# Patient Record
Sex: Male | Born: 1965 | Race: Black or African American | Hispanic: No | Marital: Married | Smoking: Current some day smoker
Health system: Southern US, Community
[De-identification: ages and names within clinical notes are randomized; demographics above are authoritative.]

## PROBLEM LIST (undated history)

## (undated) DIAGNOSIS — T7840XA Allergy, unspecified, initial encounter: Secondary | ICD-10-CM

## (undated) HISTORY — DX: Allergy, unspecified, initial encounter: T78.40XA

---

## 2002-01-15 ENCOUNTER — Emergency Department (HOSPITAL_COMMUNITY): Admission: EM | Admit: 2002-01-15 | Discharge: 2002-01-15 | Payer: Self-pay

## 2002-12-24 ENCOUNTER — Encounter: Admission: RE | Admit: 2002-12-24 | Discharge: 2002-12-24 | Payer: Self-pay | Admitting: Chiropractic Medicine

## 2002-12-24 ENCOUNTER — Encounter: Payer: Self-pay | Admitting: Chiropractic Medicine

## 2013-04-30 ENCOUNTER — Ambulatory Visit: Payer: Self-pay | Admitting: Family Medicine

## 2013-04-30 VITALS — BP 122/70 | HR 77 | Temp 98.8°F | Resp 18 | Ht 69.0 in | Wt 166.0 lb

## 2013-04-30 DIAGNOSIS — Z0289 Encounter for other administrative examinations: Secondary | ICD-10-CM

## 2013-04-30 NOTE — Progress Notes (Signed)
Commercial Driver Medical Examination   Fernando Park is a 47 y.o. male who presents today for a commercial driver fitness determination physical exam. The patient reports no problems. The following portions of the patient's history were reviewed and updated as appropriate: allergies, current medications, past family history, past medical history, past social history, past surgical history and problem list. Review of Systems Pertinent items are noted in HPI.   Objective:    Vision:  Uncorrected Corrected Horizontal Field of Vision  Right Eye 20/40 na 85 degrees  Left Eye  20/25 na 85 degrees  Both Eyes  20/25 na    Applicant can recognize and distinguish among traffic control signals and devices showing standard red, green, and amber colors.     Monocular Vision?: No   Hearing:   500 Hz 1000 Hz 2000 Hz 4000 Hz  Right Ear  na na na na  Left Ear  na na na na      BP 122/70  Pulse 77  Temp(Src) 98.8 F (37.1 C) (Oral)  Resp 18  Ht 5\' 9"  (1.753 m)  Wt 166 lb (75.297 kg)  BMI 24.5 kg/m2  SpO2 99%  General Appearance:    Alert, cooperative, no distress, appears stated age  Head:    Normocephalic, without obvious abnormality, atraumatic  Eyes:    PERRL, conjunctiva/corneas clear, EOM's intact, fundi    benign, both eyes       Ears:    Normal TM's and external ear canals, both ears  Nose:   Nares normal, septum midline, mucosa normal, no drainage    or sinus tenderness  Throat:   Lips, mucosa, and tongue normal; teeth and gums normal  Neck:   Supple, symmetrical, trachea midline, no adenopathy;       thyroid:  No enlargement/tenderness/nodules; no carotid   bruit or JVD  Back:     Symmetric, no curvature, ROM normal, no CVA tenderness  Lungs:     Clear to auscultation bilaterally, respirations unlabored  Chest wall:    No tenderness or deformity  Heart:    Regular rate and rhythm, S1 and S2 normal, no murmur, rub   or gallop  Abdomen:     Soft, non-tender, bowel sounds  active all four quadrants,    no masses, no organomegaly  Genitalia:    Normal male without lesion, discharge or tenderness     Extremities:   Extremities normal, atraumatic, no cyanosis or edema  Pulses:   2+ and symmetric all extremities  Skin:   Skin color, texture, turgor normal, no rashes or lesions  Lymph nodes:   Cervical, supraclavicular, and axillary nodes normal  Neurologic:   CNII-XII intact. Normal strength, sensation and reflexes      throughout    Labs: No results found for this basename: SPECGRAV, PROTEINUR, BILIRUBINUR, GLUCOSEU  SG 1.020, trc prot, neg gluc, neg bld    Assessment:    Healthy male exam.  Meets standards in 63 CFR 391.41;  qualifies for 2 year certificate.    Plan:    Medical examiners certificate completed and printed. Return as needed.

## 2013-04-30 NOTE — Progress Notes (Signed)
ua-spgr-1.020,protein-trace,blood-neg,glucose-neg

## 2015-11-22 ENCOUNTER — Ambulatory Visit (INDEPENDENT_AMBULATORY_CARE_PROVIDER_SITE_OTHER): Payer: Self-pay | Admitting: Physician Assistant

## 2015-11-22 VITALS — BP 162/88 | HR 70 | Temp 98.4°F | Resp 18 | Ht 69.0 in | Wt 171.2 lb

## 2015-11-22 DIAGNOSIS — Z0289 Encounter for other administrative examinations: Secondary | ICD-10-CM

## 2015-11-22 DIAGNOSIS — Z021 Encounter for pre-employment examination: Secondary | ICD-10-CM

## 2015-11-22 NOTE — Progress Notes (Signed)
Commercial Driver Medical Examination   Fernando Park is a 50 y.o. male who presents today for a commercial driver fitness determination physical exam. The patient reports no problems. The following portions of the patient's history were reviewed and updated as appropriate: allergies, current medications, past family history, past medical history, past social history, past surgical history and problem list.  Pt denies hx HTN, DM, OSA. No tobacco use He does not have a PCP.  States he has never had problems with high blood pressure before. Only meds he takes are for allergies.   Review of Systems Pertinent items are noted in HPI.   Objective:    Vision/hearing:  Visual Acuity Screening   Right eye Left eye Both eyes  Without correction:     With correction:  Comments: Peripheral Vision: Right eye 85 degrees. Left eye 85 degrees.  The patient can distinguish the colors red, amber and green.  Hearing Screening Comments: The patient was able to hear a forced whisper from 10 feet.  Applicant can recognize and distinguish among traffic control signals and devices showing standard red, green, and amber colors.  Corrective lenses required: Yes  Monocular Vision?: No  Hearing aid requirement: No  Physical Exam  Constitutional: He is oriented to person, place, and time. He appears well-developed and well-nourished. No distress.  HENT:  Head: Normocephalic and atraumatic.  Right Ear: Hearing, tympanic membrane, external ear and ear canal normal.  Left Ear: Hearing, tympanic membrane, external ear and ear canal normal.  Nose: Nose normal.  Mouth/Throat: Uvula is midline, oropharynx is clear and moist and mucous membranes are normal.  Left TM obstructed by cerumen  Eyes: Conjunctivae, EOM and lids are normal. Pupils are equal, round, and reactive to light. Right eye exhibits no discharge. Left eye exhibits no discharge. No scleral icterus.  Neck: Trachea normal  and normal range of motion. Neck supple. Carotid bruit is not present. No thyromegaly present.  Cardiovascular: Normal rate, regular rhythm, normal heart sounds, intact distal pulses and normal pulses.   No murmur heard. Pulmonary/Chest: Effort normal and breath sounds normal. No respiratory distress. He has no wheezes. He has no rhonchi. He has no rales.  Abdominal: Soft. Normal appearance. There is no tenderness.  Musculoskeletal: Normal range of motion. He exhibits no edema or tenderness.  Lymphadenopathy:       Head (right side): No submental, no submandibular, no tonsillar, no preauricular and no posterior auricular adenopathy present.       Head (left side): No submental, no submandibular, no tonsillar, no preauricular and no posterior auricular adenopathy present.    He has no cervical adenopathy.  Neurological: He is alert and oriented to person, place, and time. He has normal strength and normal reflexes. No cranial nerve deficit. Coordination and gait normal.  Skin: Skin is warm, dry and intact. No lesion and no rash noted.  Psychiatric: He has a normal mood and affect. His speech is normal and behavior is normal. Judgment and thought content normal.   BP 146/62 mmHg  Pulse 70  Temp(Src) 98.4 F (36.9 C) (Oral)  Resp 18  Ht  (1.753 m)  Wt 171 lb 3.2 oz (77.656 kg)  BMI 25.27 kg/m2  SpO2 100%  Labs:  SP GR 1.010, PRO neg BLOOD tr  GLU neg  Assessment:    Healthy male exam.  Meets standards, but periodic monitoring required due to HTN.  Driver qualified only for 3 months.    Plan:  Medical examiners certificate completed and printed. Need follow-up in 3 months for recheck of HTN. -- lowest BP was 146/62, other BPs 160/80 and 148/86 Pt has been advised to establish care with PCP for treatment of BP.

## 2015-11-22 NOTE — Patient Instructions (Signed)
     IF you received an x-ray today, you will receive an invoice from [facility]. Please contact [facility] at [phone] with questions or concerns regarding your invoice.   IF you received labwork today, you will receive an invoice from [facility]. Please contact [facility] at [phone] with questions or concerns regarding your invoice.   Our billing staff will not be able to assist you with questions regarding bills from these companies.  You will be contacted with the lab results as soon as they are available. The fastest way to get your results is to activate your My Chart account. Instructions are located on the last page of this paperwork. If you have not heard from us regarding the results in 2 weeks, please contact this office.      

## 2016-03-24 ENCOUNTER — Ambulatory Visit: Payer: Self-pay

## 2016-04-19 ENCOUNTER — Ambulatory Visit (INDEPENDENT_AMBULATORY_CARE_PROVIDER_SITE_OTHER): Payer: Self-pay | Admitting: Family Medicine

## 2016-04-19 VITALS — BP 120/70 | HR 68 | Temp 97.7°F | Resp 16 | Ht 69.0 in | Wt 167.4 lb

## 2016-04-19 DIAGNOSIS — Z024 Encounter for examination for driving license: Secondary | ICD-10-CM

## 2016-04-19 NOTE — Patient Instructions (Signed)
     IF you received an x-ray today, you will receive an invoice from [facility]. Please contact [facility] at [phone] with questions or concerns regarding your invoice.   IF you received labwork today, you will receive an invoice from [facility]. Please contact [facility] at [phone] with questions or concerns regarding your invoice.   Our billing staff will not be able to assist you with questions regarding bills from these companies.  You will be contacted with the lab results as soon as they are available. The fastest way to get your results is to activate your My Chart account. Instructions are located on the last page of this paperwork. If you have not heard from us regarding the results in 2 weeks, please contact this office.      

## 2016-04-19 NOTE — Progress Notes (Signed)
Commercial Driver Medical Examination   Fernando SaxonRobert B Park is a 50 y.o. male who presents today for a commercial driver fitness determination physical exam. The patient reports no problems.  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 No prior h/o HTN.  He was given a temporary card and recommended to follow-up with his PCP for further eval and treatment. Since that time pt has increased exercise, changed diet, and started asa 81mg . He has checked his BP outside of the office several times and BP has been in 120s/80s.  The following portions of the patient's history were reviewed and updated as appropriate: allergies, current medications, past family history, past medical history, past social history, past surgical history and problem list.  Review of Systems Pertinent items are noted in HPI.   Objective:    Vision:   Visual Acuity Screening   Right eye Left eye Both eyes  Without correction:     With correction: 20/30 20/30 20/20   Comments: Color: 6/6 Normal, Peripheral 85 degrees both eyes. Normal    Applicant can recognize and distinguish among traffic control signals and devices showing standard red, green, and amber colors.  Applicant meets visual acuity requirement only when wearing corrective lenses.  Monocular Vision?: No   Hearing: Hearing Screening Comments: Whisper test at 10 ft. Normal.    BP 120/70   Pulse 68   Temp 97.7 F (36.5 C) (Oral)   Resp 16   Ht 5\' 9"  (1.753 m)   Wt 167 lb 6.4 oz (75.9 kg)   SpO2 99%   BMI 24.72 kg/m   General Appearance:    Alert, cooperative, no distress, appears stated age  Head:    Normocephalic, without obvious abnormality, atraumatic  Eyes:    PERRL, conjunctiva/corneas clear, EOM's intact, fundi    benign, both eyes       Ears:    Normal TM's and external ear canals, both ears  Nose:   Nares normal, septum midline, mucosa normal, no drainage    or sinus tenderness  Throat:   Lips, mucosa, and tongue  normal; teeth and gums normal  Neck:   Supple, symmetrical, trachea midline, no adenopathy;       thyroid:  No enlargement/tenderness/nodules; no carotid   bruit or JVD  Back:     Symmetric, no curvature, ROM normal, no CVA tenderness  Lungs:     Clear to auscultation bilaterally, respirations unlabored  Chest wall:    No tenderness or deformity  Heart:    Regular rate and rhythm, S1 and S2 normal, no murmur, rub   or gallop  Abdomen:     Soft, non-tender, bowel sounds active all four quadrants,    no masses, no organomegaly  Genitalia:  No inguinal hernias     Extremities:   Extremities normal, atraumatic, no cyanosis or edema  Pulses:   2+ and symmetric all extremities  Skin:   Skin color, texture, turgor normal, no rashes or lesions  Lymph nodes:   Cervical, supraclavicular, and axillary nodes normal  Neurologic:   CNII-XII intact. Normal strength, sensation and reflexes      throughout     Labs:  SG 1.020, blood small, prot neg, gluc neg, leuks small  Assessment:    Healthy male exam.  Meets standards in 5449 CFR 391.41;  qualifies for 2 year certificate.   with corrective lenses Plan:    Medical examiners certificate completed and printed. Return as needed.

## 2016-08-26 ENCOUNTER — Ambulatory Visit (INDEPENDENT_AMBULATORY_CARE_PROVIDER_SITE_OTHER): Payer: Managed Care, Other (non HMO) | Admitting: Family Medicine

## 2016-08-26 VITALS — BP 120/60 | HR 70 | Temp 98.2°F | Resp 16 | Ht 70.0 in | Wt 167.0 lb

## 2016-08-26 DIAGNOSIS — Z131 Encounter for screening for diabetes mellitus: Secondary | ICD-10-CM

## 2016-08-26 DIAGNOSIS — Z23 Encounter for immunization: Secondary | ICD-10-CM

## 2016-08-26 DIAGNOSIS — Z1329 Encounter for screening for other suspected endocrine disorder: Secondary | ICD-10-CM | POA: Diagnosis not present

## 2016-08-26 DIAGNOSIS — Z0000 Encounter for general adult medical examination without abnormal findings: Secondary | ICD-10-CM

## 2016-08-26 DIAGNOSIS — Z130 Encounter for screening for diseases of the blood and blood-forming organs and certain disorders involving the immune mechanism: Secondary | ICD-10-CM | POA: Diagnosis not present

## 2016-08-26 DIAGNOSIS — Z13220 Encounter for screening for lipoid disorders: Secondary | ICD-10-CM | POA: Diagnosis not present

## 2016-08-26 LAB — POCT URINALYSIS DIP (MANUAL ENTRY)
BILIRUBIN UA: NEGATIVE
Bilirubin, UA: NEGATIVE
GLUCOSE UA: NEGATIVE
LEUKOCYTES UA: NEGATIVE
Nitrite, UA: NEGATIVE
PROTEIN UA: NEGATIVE
SPEC GRAV UA: 1.02
Urobilinogen, UA: 0.2
pH, UA: 5.5

## 2016-08-26 NOTE — Patient Instructions (Addendum)
You will notified once your labs have resulted.  I will complete and fax your form. The original will remain here at the office for you to pick up with lab results listed.  As we discussed, your colonoscopy will be due after your 51th birthday. I am including information about Cologuard testing if you would like this option oppose to traditional colonoscopy screening for colon cancer.    IF you received an x-ray today, you will receive an invoice from [facility]. Please contact [facility] at [phone] with questions or concerns regarding your invoice.   IF you received labwork today, you will receive an invoice from [facility]. Please contact [facility] at [phone] with questions or concerns regarding your invoice.   Our billing staff will not be able to assist you with questions regarding bills from these companies.  You will be contacted with the lab results as soon as they are available. The fastest way to get your results is to activate your My Chart account. Instructions are located on the last page of this paperwork. If you have not heard from [place] regarding the results in 2 weeks, please contact this office.     Exercising to Stay Healthy Introduction Exercising regularly is important. It has many health benefits, such as:  Improving your overall fitness, flexibility, and endurance.  Increasing your bone density.  Helping with weight control.  Decreasing your body fat.  Increasing your muscle strength.  Reducing stress and tension.  Improving your overall health. In order to become healthy and stay healthy, it is recommended that you do moderate-intensity and vigorous-intensity exercise. You can tell that you are exercising at a moderate intensity if you have a higher heart rate and faster breathing, but you are still able to hold a conversation. You can tell that you are exercising at a vigorous intensity if you are breathing much harder and faster and  cannot hold a conversation while exercising. How often should I exercise? Choose an activity that you enjoy and set realistic goals. Your health care provider can help you to make an activity plan that works for you. Exercise regularly as directed by your health care provider. This may include:  Doing resistance training twice each week, such as:  Push-ups.  Sit-ups.  Lifting weights.  Using resistance bands.  Doing a given intensity of exercise for a given amount of time. Choose from these options:  150 minutes of moderate-intensity exercise every week.  75 minutes of vigorous-intensity exercise every week.  A mix of moderate-intensity and vigorous-intensity exercise every week. Children, pregnant women, people who are out of shape, people who are overweight, and older adults may need to consult a health care provider for individual recommendations. If you have any sort of medical condition, be sure to consult your health care provider before starting a new exercise program. What are some exercise ideas? Some moderate-intensity exercise ideas include:  Walking at a rate of 1 mile in 15 minutes.  Biking.  Hiking.  Golfing.  Dancing. Some vigorous-intensity exercise ideas include:  Walking at a rate of at least 4.5 miles per hour.  Jogging or running at a rate of 5 miles per hour.  Biking at a rate of at least 10 miles per hour.  Lap swimming.  Roller-skating or in-line skating.  Cross-country skiing.  Vigorous competitive sports, such as football, basketball, and soccer.  Jumping rope.  Aerobic dancing. What are some everyday activities that can help me to get exercise?  Yard work, such as:  Pushing a Surveyor, mininglawn mower.  Raking and bagging leaves.  Washing and waxing your car.  Pushing a stroller.  Shoveling snow.  Gardening.  Washing windows or floors. How can I be more active in my day-to-day activities?  Use the stairs instead of the  elevator.  Take a walk during your lunch break.  If you drive, park your car farther away from work or school.  If you take public transportation, get off one stop early and walk the rest of the way.  Make all of your phone calls while standing up and walking around.  Get up, stretch, and walk around every 30 minutes throughout the day. What guidelines should I follow while exercising?  Do not exercise so much that you hurt yourself, feel dizzy, or get very short of breath.  Consult your health care provider before starting a new exercise program.  Wear comfortable clothes and shoes with good support.  Drink plenty of water while you exercise to prevent dehydration or heat stroke. Body water is lost during exercise and must be replaced.  Work out until you breathe faster and your heart beats faster. This information is not intended to replace advice given to you by your health care provider. Make sure you discuss any questions you have with your health care provider. Document Released: 07/29/2010 Document Revised: 12/02/2015 Document Reviewed: 11/27/2013  2017 Elsevier

## 2016-08-26 NOTE — Progress Notes (Signed)
Fernando SaxonRobert B Mccreery  MRN: [id number] DOB: 05/31/1966  Subjective:   Fernando MaduroRobert is a 51 y.o. male who presents for annual physical exam and requests to have employee health forms completed. Pt is currently working as an Engineer, siteHVAC technician. Last CPE was two years ago, although he recently completed a DOT. physical within the last 6 months. He denies any acute concerns or problems today. Last dental exam: appointment scheduled for March 2018 Last vision exam: 2017 vision exam and received a glaucoma screening which was normal.  Outstanding Health Maintenance items include: HIV Testing and TDAP vaccination   Current everyday smoker of cigars for greather than 20 years.   Current Outpatient Prescriptions on File Prior to Visit  Medication Sig Dispense Refill  . triamcinolone (NASACORT ALLERGY 24HR) 55 MCG/ACT AERO nasal inhaler Place 2 sprays into the nose daily.     No current facility-administered medications on file prior to visit.     No Known Allergies  Social History   Social History  . Marital status: Married    Spouse name: N/A  . Number of children: N/A  . Years of education: N/A   Social History Main Topics  . Smoking status: Current Some Day Smoker    Types: Cigars  . Smokeless tobacco: Never Used  . Alcohol use Yes     Comment: occas  . Drug use: No  . Sexual activity: Not Asked   Other Topics Concern  . None   Social History Narrative  . None    No past surgical history on file.  Family History  Problem Relation Age of Onset  . Hypertension Mother   . Arthritis Mother     Review of Systems  HENT: Negative.   Respiratory: Negative.   Cardiovascular: Negative.   Genitourinary: Negative.   Musculoskeletal: Positive for back pain.       Chronic pain that he self manages with repositioning and strength training exercises   Neurological: Negative.   Psychiatric/Behavioral: The patient is nervous/anxious.     Objective:  BP 120/60   Pulse 70   Temp 98.2  F (36.8 C) (Oral)   Resp 16   Ht 5\' 10"  (1.778 m)   Wt 167 lb (75.8 kg)   SpO2 98%   BMI 23.96 kg/m   Physical Exam  Constitutional: He is oriented to person, place, and time and well-developed, well-nourished, and in no distress.  HENT:  Head: Normocephalic and atraumatic.  Right Ear: External ear normal.  Left Ear: External ear normal.  Nose: Nose normal.  Mouth/Throat: Oropharynx is clear and moist.  Eyes: Conjunctivae and EOM are normal. Pupils are equal, round, and reactive to light.  Neck: Normal range of motion. Neck supple.  Cardiovascular: Normal rate, normal heart sounds and intact distal pulses.   Pulmonary/Chest: Effort normal and breath sounds normal.  Musculoskeletal: Normal range of motion.  Neurological: He is alert and oriented to person, place, and time. Gait normal. GCS score is 15.  Skin: Skin is warm and dry.  Psychiatric: Mood, memory and judgment normal. He has a flat affect.    Visual Acuity Screening   Right eye Left eye Both eyes  Without correction: 20/50 -1 20/25 -1 20/25  With correction:       Assessment and Plan :  Discussed healthy lifestyle, diet, exercise, preventative care, vaccinations, and addressed patient's concerns. Plan for follow up in 12 months for annual exam if all labs are within normal range. Otherwise, plan for specific conditions below.  1. Annual physical exam Age-appropriate anticipatory guidance provided.  2. Screening cholesterol level   3. Screening for deficiency anemia  4. Screening for thyroid disorder  5. Screening for diabetes mellitus   I will complete your employee health form upon receipt of your lab results.  Godfrey Pick. Tiburcio Pea, MSN, FNP-C Primary Care at [facility] [phone]

## 2016-08-27 LAB — CMP14+EGFR
A/G RATIO: 1.7 (ref 1.2–2.2)
ALBUMIN: 4.7 g/dL (ref 3.5–5.5)
ALT: 22 IU/L (ref 0–44)
AST: 24 IU/L (ref 0–40)
Alkaline Phosphatase: 65 IU/L (ref 39–117)
BUN / CREAT RATIO: 9 (ref 9–20)
BUN: 10 mg/dL (ref 6–24)
Bilirubin Total: 0.5 mg/dL (ref 0.0–1.2)
CALCIUM: 10 mg/dL (ref 8.7–10.2)
CO2: 26 mmol/L (ref 18–29)
CREATININE: 1.13 mg/dL (ref 0.76–1.27)
Chloride: 99 mmol/L (ref 96–106)
GFR, EST AFRICAN AMERICAN: 88 mL/min/{1.73_m2} (ref >59)
GFR, EST NON AFRICAN AMERICAN: 76 mL/min/{1.73_m2} (ref >59)
GLOBULIN, TOTAL: 2.7 g/dL (ref 1.5–4.5)
Glucose: 102 mg/dL — ABNORMAL HIGH (ref 65–99)
POTASSIUM: 4.4 mmol/L (ref 3.5–5.2)
SODIUM: 141 mmol/L (ref 134–144)
Total Protein: 7.4 g/dL (ref 6.0–8.5)

## 2016-08-27 LAB — CBC WITH DIFFERENTIAL/PLATELET
BASOS: 0 %
Basophils Absolute: 0 10*3/uL (ref 0.0–0.2)
EOS (ABSOLUTE): 0.1 10*3/uL (ref 0.0–0.4)
EOS: 1 %
HEMATOCRIT: 42.6 % (ref 37.5–51.0)
Hemoglobin: 14.9 g/dL (ref 13.0–17.7)
IMMATURE GRANULOCYTES: 0 %
Immature Grans (Abs): 0 10*3/uL (ref 0.0–0.1)
LYMPHS ABS: 3 10*3/uL (ref 0.7–3.1)
Lymphs: 32 %
MCH: 30 pg (ref 26.6–33.0)
MCHC: 35 g/dL (ref 31.5–35.7)
MCV: 86 fL (ref 79–97)
MONOS ABS: 0.7 10*3/uL (ref 0.1–0.9)
Monocytes: 8 %
NEUTROS ABS: 5.6 10*3/uL (ref 1.4–7.0)
Neutrophils: 59 %
Platelets: 342 10*3/uL (ref 150–379)
RBC: 4.96 x10E6/uL (ref 4.14–5.80)
RDW: 13.7 % (ref 12.3–15.4)
WBC: 9.5 10*3/uL (ref 3.4–10.8)

## 2016-08-27 LAB — LIPID PANEL
CHOL/HDL RATIO: 3.3 ratio (ref 0.0–5.0)
Cholesterol, Total: 169 mg/dL (ref 100–199)
HDL: 51 mg/dL (ref >39)
LDL Calculated: 101 mg/dL — ABNORMAL HIGH (ref 0–99)
Triglycerides: 85 mg/dL (ref 0–149)
VLDL Cholesterol Cal: 17 mg/dL (ref 5–40)

## 2016-08-27 LAB — TSH: TSH: 0.726 u[IU]/mL (ref 0.450–4.500)

## 2016-08-29 ENCOUNTER — Telehealth: Payer: Self-pay | Admitting: Family Medicine

## 2016-08-29 NOTE — Telephone Encounter (Signed)
Call patient to return to office to sign employment physical form and we need to obtain his waist circumference as this was not completed during his original visit.  Once completed, scan a copy to patient EMR and fax per document.   Godfrey PickKimberly S. Tiburcio PeaHarris, MSN, FNP-C Primary Care at [facility] [phone]

## 2016-08-29 NOTE — Telephone Encounter (Signed)
Returned, faxed and sent to scan . Pt got copy

## 2017-10-20 ENCOUNTER — Encounter: Payer: Self-pay | Admitting: Physician Assistant

## 2017-10-20 ENCOUNTER — Ambulatory Visit (INDEPENDENT_AMBULATORY_CARE_PROVIDER_SITE_OTHER): Payer: 59 | Admitting: Physician Assistant

## 2017-10-20 ENCOUNTER — Other Ambulatory Visit: Payer: Self-pay

## 2017-10-20 VITALS — BP 138/76 | HR 89 | Temp 98.2°F | Resp 16 | Ht 69.5 in | Wt 162.6 lb

## 2017-10-20 DIAGNOSIS — F4310 Post-traumatic stress disorder, unspecified: Secondary | ICD-10-CM | POA: Diagnosis not present

## 2017-10-20 MED ORDER — FLUOXETINE HCL 10 MG PO TABS: 10.0000 mg | ORAL_TABLET | Freq: Every day | ORAL | 1 refills | Status: DC

## 2017-10-20 NOTE — Progress Notes (Signed)
   Valentino SaxonRobert B Collister  MRN: [id number] DOB: 03/22/1966  PCP: Patient, No Pcp Per  Subjective:  Pt is a 52 year old male who presents to clinic for mood disorder.   He was dx with PTSD and dysthymia in 2002. Was prescribed Zoloft 50mg  at that time, but after his former clinic closed, "I have been getting Zoloft off of the street and I don't want to do it like this anymore."  When not taking Zoloft he is easily agitated especially with his 51 year old son.  He is requesting Zoloft 50mg  today. C/o profuse night sweats while taking this medication. He does not experience night sweats while not taking this. He does not consistently have this medication as he orders it from [facility] or buys it from dealers from the street. He now has insurance so "why not" get it from clinic.  He lives with his wife and 2 children.  Denies current SI or HI. He has had suicidal thoughts in the past. Has never acted on them.   Review of Systems  Constitutional: Positive for diaphoresis. Negative for fatigue.  Gastrointestinal: Negative for abdominal pain, nausea and vomiting.  Psychiatric/Behavioral: Positive for dysphoric mood. Negative for self-injury and suicidal ideas. The patient is nervous/anxious.     There are no active problems to display for this patient.   Current Outpatient Medications on File Prior to Visit  Medication Sig Dispense Refill  . triamcinolone (NASACORT ALLERGY 24HR) 55 MCG/ACT AERO nasal inhaler Place 2 sprays into the nose daily.     No current facility-administered medications on file prior to visit.     No Known Allergies   Objective:  BP 138/76   Pulse 89   Temp 98.2 F (36.8 C) (Oral)   Resp 16   Ht 5' 9.5" (1.765 m)   Wt 162 lb 9.6 oz (73.8 kg)   SpO2 99%   BMI 23.67 kg/m   Physical Exam  Constitutional: He is oriented to person, place, and time. He appears well-developed and well-nourished.  Cardiovascular: Normal rate and regular rhythm.  Pulmonary/Chest: Effort  normal. No respiratory distress.  Neurological: He is alert and oriented to person, place, and time.  Skin: Skin is warm and dry.  Psychiatric: He has a normal mood and affect. His behavior is normal. Judgment and thought content normal.  Vitals reviewed.   Assessment and Plan :  1. PTSD (post-traumatic stress disorder) - FLUoxetine (PROZAC) 10 MG tablet; Take 1 tablet (10 mg total) by mouth daily. After 3 weeks of therapy you may increase dose by 10mg  increments in weekly intervals  Dispense: 90 tablet; Refill: 1 - Pt presents for medication rx of Zoloft, which he has been getting off the street for the past several years. Denies SI or HI. C/o SE of night sweats. Plan to try prozac with instruction to titrate up. RTC in 6 weeks for annual exam. Plan to recheck PTSD and medication dose at that visit. He understands and agrees with plan.   Marco CollieWhitney Landree Fernholz, PA-C  Primary Care at [facility] 10/20/2017 12:10 PM

## 2017-10-20 NOTE — Patient Instructions (Addendum)
Start taking Prozac 38m. After 3 weeks, you can start increasing the dose in 174mincrements x 1 week (see below) Starting dose: 10 mg once daily for 3 weeks.  If needed you can increase the dose to 2017may. Take this for at least 1-2 weeks. (I suspect you will feel good on 58m43mf needed you can increase to 30mg44m. Take this for at least 1-2 weeks.   (The max dose is 80 mg/day. The usual dosage range is 20 - 60 mg/day)    Please come back and see me in 6-8 weeks for recheck.   Thank you for coming in today. I hope you feel we met your needs.  Feel free to call PCP if you have any questions or further requests.  Please consider signing up for MyChart if you do not already have it, as this is a great way to communicate with me.  Best,  Whitney McVey, PA-C  Fluoxetine capsules or tablets (Depression/Mood Disorders) What is this medicine? FLUOXETINE (floo OX e teen) belongs to a class of drugs known as selective serotonin reuptake inhibitors (SSRIs). It helps to treat mood problems such as depression, obsessive compulsive disorder, and panic attacks. It can also treat certain eating disorders. This medicine may be used for other purposes; ask your health care provider or pharmacist if you have questions. COMMON BRAND NAME(S): Prozac What should I tell my health care provider before I take this medicine? They need to know if you have any of these conditions: -bipolar disorder or a family history of bipolar disorder -bleeding disorders -glaucoma -heart disease -liver disease -low levels of sodium in the blood -seizures -suicidal thoughts, plans, or attempt; a previous suicide attempt by you or a family member -take MAOIs like Carbex, Eldepryl, Marplan, Nardil, and Parnate -take medicines that treat or prevent blood clots -thyroid disease -an unusual or allergic reaction to fluoxetine, other medicines, foods, dyes, or preservatives -pregnant or trying to get  pregnant -breast-feeding How should I use this medicine? Take this medicine by mouth with a glass of water. Follow the directions on the prescription label. You can take this medicine with or without food. Take your medicine at regular intervals. Do not take it more often than directed. Do not stop taking this medicine suddenly except upon the advice of your doctor. Stopping this medicine too quickly may cause serious side effects or your condition may worsen. A special MedGuide will be given to you by the pharmacist with each prescription and refill. Be sure to read this information carefully each time. Talk to your pediatrician regarding the use of this medicine in children. While this drug may be prescribed for children as young as 7 years for selected conditions, precautions do apply. Overdosage: If you think you have taken too much of this medicine contact a poison control center or emergency room at once. NOTE: This medicine is only for you. Do not share this medicine with others. What if I miss a dose? If you miss a dose, skip the missed dose and go back to your regular dosing schedule. Do not take double or extra doses. What may interact with this medicine? Do not take this medicine with any of the following medications: -other medicines containing fluoxetine, like Sarafem or Symbyax -cisapride -linezolid -MAOIs like Carbex, Eldepryl, Marplan, Nardil, and Parnate -methylene blue (injected into a vein) -pimozide -thioridazine This medicine may also interact with the following medications: -alcohol -amphetamines -aspirin and aspirin-like medicines -carbamazepine -certain medicines for depression, anxiety, or  psychotic disturbances -certain medicines for migraine headaches like almotriptan, eletriptan, frovatriptan, naratriptan, rizatriptan, sumatriptan, zolmitriptan -digoxin -diuretics -fentanyl -flecainide -furazolidone -isoniazid -lithium -medicines for sleep -medicines that  treat or prevent blood clots like warfarin, enoxaparin, and dalteparin -NSAIDs, medicines for pain and inflammation, like ibuprofen or naproxen -phenytoin -procarbazine -propafenone -rasagiline -ritonavir -supplements like St. John's wort, kava kava, valerian -tramadol -tryptophan -vinblastine This list may not describe all possible interactions. Give your health care provider a list of all the medicines, herbs, non-prescription drugs, or dietary supplements you use. Also tell them if you smoke, drink alcohol, or use illegal drugs. Some items may interact with your medicine. What should I watch for while using this medicine? Tell your doctor if your symptoms do not get better or if they get worse. Visit your doctor or health care professional for regular checks on your progress. Because it may take several weeks to see the full effects of this medicine, it is important to continue your treatment as prescribed by your doctor. Patients and their families should watch out for new or worsening thoughts of suicide or depression. Also watch out for sudden changes in feelings such as feeling anxious, agitated, panicky, irritable, hostile, aggressive, impulsive, severely restless, overly excited and hyperactive, or not being able to sleep. If this happens, especially at the beginning of treatment or after a change in dose, call your health care professional. Dennis Bast may get drowsy or dizzy. Do not drive, use machinery, or do anything that needs mental alertness until you know how this medicine affects you. Do not stand or sit up quickly, especially if you are an older patient. This reduces the risk of dizzy or fainting spells. Alcohol may interfere with the effect of this medicine. Avoid alcoholic drinks. Your mouth may get dry. Chewing sugarless gum or sucking hard candy, and drinking plenty of water may help. Contact your doctor if the problem does not go away or is severe. This medicine may affect blood  sugar levels. If you have diabetes, check with your doctor or health care professional before you change your diet or the dose of your diabetic medicine. What side effects may I notice from receiving this medicine? Side effects that you should report to your doctor or health care professional as soon as possible: -allergic reactions like skin rash, itching or hives, swelling of the face, lips, or tongue -anxious -black, tarry stools -breathing problems -changes in vision -confusion -elevated mood, decreased need for sleep, racing thoughts, impulsive behavior -eye pain -fast, irregular heartbeat -feeling faint or lightheaded, falls -feeling agitated, angry, or irritable -hallucination, loss of contact with reality -loss of balance or coordination -loss of memory -painful or prolonged erections -restlessness, pacing, inability to keep still -seizures -stiff muscles -suicidal thoughts or other mood changes -trouble sleeping -unusual bleeding or bruising -unusually weak or tired -vomiting Side effects that usually do not require medical attention (report to your doctor or health care professional if they continue or are bothersome): -change in appetite or weight -change in sex drive or performance -diarrhea -dry mouth -headache -increased sweating -nausea -tremors This list may not describe all possible side effects. Call your doctor for medical advice about side effects. You may report side effects to FDA at 1-800-FDA-1088. Where should I keep my medicine? Keep out of the reach of children. Store at room temperature between 15 and 30 degrees C (59 and 86 degrees F). Throw away any unused medicine after the expiration date. NOTE: This sheet is a summary. It may not  cover all possible information. If you have questions about this medicine, talk to your doctor, pharmacist, or health care provider.  2018 Elsevier/Gold Standard (2015-11-27 15:55:27)   IF you received an x-ray  today, you will receive an invoice from [facility]. Please contact [facility] at [phone] with questions or concerns regarding your invoice.   IF you received labwork today, you will receive an invoice from [facility]. Please contact [facility] at [phone] with questions or concerns regarding your invoice.   Our billing staff will not be able to assist you with questions regarding bills from these companies.  You will be contacted with the lab results as soon as they are available. The fastest way to get your results is to activate your My Chart account. Instructions are located on the last page of this paperwork. If you have not heard from [place] regarding the results in 2 weeks, please contact this office.

## 2017-11-30 ENCOUNTER — Ambulatory Visit: Payer: 59 | Admitting: Physician Assistant

## 2018-01-09 ENCOUNTER — Ambulatory Visit (HOSPITAL_COMMUNITY)
Admission: EM | Admit: 2018-01-09 | Discharge: 2018-01-09 | Disposition: A | Discharge status: Home / Self Care | Payer: Self-pay

## 2018-01-09 ENCOUNTER — Ambulatory Visit (HOSPITAL_COMMUNITY)
Admission: EM | Admit: 2018-01-09 | Discharge: 2018-01-09 | Disposition: A | Discharge status: Home / Self Care | Payer: 59

## 2018-01-09 ENCOUNTER — Encounter (HOSPITAL_COMMUNITY): Payer: Self-pay | Admitting: Emergency Medicine

## 2018-01-09 DIAGNOSIS — S0181XA Laceration without foreign body of other part of head, initial encounter: Secondary | ICD-10-CM

## 2018-01-09 DIAGNOSIS — Z4802 Encounter for removal of sutures: Secondary | ICD-10-CM

## 2018-01-09 NOTE — ED Notes (Signed)
Bed: UC01 Expected date:  Expected time:  Means of arrival:  Comments: appts  

## 2018-01-09 NOTE — ED Triage Notes (Signed)
Pt here for suture removal from forehead.  

## 2018-03-19 ENCOUNTER — Telehealth: Payer: Self-pay | Admitting: Physician Assistant

## 2018-03-19 NOTE — Telephone Encounter (Signed)
Pt returning call stating that he would not like to get a refill of Prozac but would like to be started on Zoloft 50 mg tab. Pt states when he came into the office he was self medicating with Zoloft off of the street. Pt states he has received 2 refills of Prozac 10mg  so far and prefers to be on Zoloft again. Pt would like for refill to [facility] to [facility] on [address].   LOV: 10/20/17 PCP: Alphonzo Lemmings McVey,PA

## 2018-03-19 NOTE — Telephone Encounter (Signed)
Called pt and left message for pt to call back to inform as to what dose the pt is taking.  Fluoxetine refill Last Refill:10/20/17 # 90 tabs 1 RF Last OV: 10/20/17 PCP: Alphonzo Lemmings McVey PA Pharmacy:[facility] [address]     Per the OV note 10/20/17:  Start taking Prozac 10mg . After 3 weeks, you can start increasing the dose in 10mg  increments x 1 week (see below) Starting dose: 10 mg once daily for 3 weeks.  If needed you can increase the dose to 20mg /day. Take this for at least 1-2 weeks. (I suspect you will feel good on 20mg ) If needed you can increase to 30mg /day. Take this for at least 1-2 weeks.   (The max dose is 80 mg/day. The usual dosage range is 20 - 60 mg/day)

## 2018-03-19 NOTE — Telephone Encounter (Signed)
Copied from CRM [id number]. Topic: Quick Communication - Rx Refill/Question >> Mar 19, 2018  2:21 PM Arlyss Gandy, NT wrote: Medication: FLUoxetine (PROZAC) 10 MG tablet   Has the patient contacted their pharmacy? Yes.   (Agent: If no, request that the patient contact the pharmacy for the refill.) (Agent: If yes, when and what did the pharmacy advise?)  Preferred Pharmacy (with phone number or street name): [id number] [place], [place] - [address] AT [address] [phone] (Phone) [phone] (Fax)    Agent: Please be advised that RX refills may take up to 3 business days. We ask that you follow-up with your pharmacy.

## 2018-03-20 NOTE — Telephone Encounter (Signed)
Please see note below. 

## 2018-03-21 ENCOUNTER — Other Ambulatory Visit: Payer: Self-pay | Admitting: Physician Assistant

## 2018-03-21 DIAGNOSIS — F4310 Post-traumatic stress disorder, unspecified: Secondary | ICD-10-CM

## 2018-03-21 MED ORDER — SERTRALINE HCL 50 MG PO TABS: 50.0000 mg | ORAL_TABLET | Freq: Every day | ORAL | 1 refills | Status: DC

## 2018-04-04 ENCOUNTER — Other Ambulatory Visit: Payer: Self-pay | Admitting: Physician Assistant

## 2018-04-04 DIAGNOSIS — F4310 Post-traumatic stress disorder, unspecified: Secondary | ICD-10-CM

## 2018-05-09 ENCOUNTER — Other Ambulatory Visit: Payer: Self-pay | Admitting: Physician Assistant

## 2018-05-09 DIAGNOSIS — F4310 Post-traumatic stress disorder, unspecified: Secondary | ICD-10-CM

## 2018-05-09 NOTE — Telephone Encounter (Signed)
Left VM; Pt needs appt.

## 2018-05-09 NOTE — Telephone Encounter (Signed)
Left VM; Pt needs appt. 

## 2018-05-12 ENCOUNTER — Other Ambulatory Visit: Payer: Self-pay | Admitting: Physician Assistant

## 2018-05-12 DIAGNOSIS — F4310 Post-traumatic stress disorder, unspecified: Secondary | ICD-10-CM

## 2018-05-13 NOTE — Telephone Encounter (Signed)
zoloft refilled for 8 tablets until next office visit on 05/29/18. Requested Prescriptions  Pending Prescriptions Disp Refills  . 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

## 2018-05-13 NOTE — Telephone Encounter (Signed)
Called pt to schedule an appointment for his refills. Pt agreed and requested an DOT appointment also.  Appointment scheduled and enough refill given until his appointment.

## 2018-05-16 NOTE — Telephone Encounter (Signed)
#  8 tabs of zoloft 50 mg given until upcoming appt. Dgaddy, CMA

## 2018-05-29 ENCOUNTER — Ambulatory Visit (INDEPENDENT_AMBULATORY_CARE_PROVIDER_SITE_OTHER): Payer: 59 | Admitting: Emergency Medicine

## 2018-05-29 ENCOUNTER — Ambulatory Visit: Payer: 59 | Admitting: Emergency Medicine

## 2018-05-29 ENCOUNTER — Encounter: Payer: Self-pay | Admitting: Emergency Medicine

## 2018-05-29 ENCOUNTER — Other Ambulatory Visit: Payer: Self-pay

## 2018-05-29 VITALS — BP 124/68 | HR 65 | Temp 98.6°F | Resp 19 | Ht 69.25 in | Wt 159.8 lb

## 2018-05-29 DIAGNOSIS — Z1211 Encounter for screening for malignant neoplasm of colon: Secondary | ICD-10-CM

## 2018-05-29 DIAGNOSIS — F4310 Post-traumatic stress disorder, unspecified: Secondary | ICD-10-CM | POA: Diagnosis not present

## 2018-05-29 DIAGNOSIS — Z024 Encounter for examination for driving license: Secondary | ICD-10-CM

## 2018-05-29 MED ORDER — SERTRALINE HCL 50 MG PO TABS: 50.0000 mg | ORAL_TABLET | Freq: Every day | ORAL | 1 refills | Status: DC

## 2018-05-29 NOTE — Progress Notes (Signed)
This patient presents for DOT examination for fitness for duty.   Medical History:  1. Head/Brain Injuries, disorders or illnesses no  2. Seizures, epilepsy no  3. Eye disorders or impaired vision (except corrective lenses) no  4. Ear disorders, loss of hearing or balance no  5. Heart disease or heart attack, other cardiovascular condition no  6. Heart surgery (valve replacement/bypass, angioplasty, pacemaker/defribrillator) no  7. High blood pressure no  8. High cholesterol no  9. Chronic cough, shortness of breath or other breathing problems no  10. Lung disease (emphysema, asthma or chronic bronchitis) no  11. Kidney disease, dialysis no  12. Digestive problems  no  13. Diabetes or elevated blood sugar no                      If yes to #13, Insulin use n/a  14. Nervious or psychiatric disorders, e.g., severe depression yes  15. Fainting or syncope no  16. Dizziness, headaches, numbness, tingling or memory loss no  17. Unexplained weight loss no  18. Stroke, TIA or paralysis no  19. Missing or impaired hand, arm, foot, leg, finger, toe no  20. Spinal injury or disease no  21. Bone, muscles or nerve problems no  22. Blood clots or bleeding bleeding disorders no  23. Cancer no  24. Chronic infection or other chronic diseases no  25. Sleep disorders, pauses in breathing while asleep, daytime sleepiness, loud snoring no  26. Have you ever had a sleep test? no  27.  Have you ever spent a night in the hospital? no  28. Have you ever had a broken bone? no  29. Have you or or do you use tobacco products? yes  30. Regular, frequent alcohol use no  31. Illegal substance use within the past 2 years no  32.  Have you ever failed a drug test or been dependent on an illegal substance? no   Current Medications: Prior to Admission medications   Medication Sig Start Date End Date Taking? Authorizing Provider  sertraline (ZOLOFT) 50 MG tablet TAKE 1 TABLET BY MOUTH EVERY DAY 05/13/18   Yes McVey, Madelaine BhatElizabeth Whitney, PA-C  triamcinolone (NASACORT ALLERGY 24HR) 55 MCG/ACT AERO nasal inhaler Place 2 sprays into the nose daily.    [provider]    Medical Examiner's Comments on Health History:  In good general medical condition.  TESTING:  No exam data present  Monocular Vision: No.  Hearing Aid used for test: No. Hearing Aid required to to meet standard: No.  BP 124/68   Pulse 65   Temp 98.6 F (37 C) (Oral)   Resp 19   Ht 5' 9.25" (1.759 m)   Wt 159 lb 12.8 oz (72.5 kg)   SpO2 100%   BMI 23.43 kg/m  Pulse rate is regular     PHYSICAL EXAMINATION:  General Appearance Not markedly obese. No tremor, signs of alcoholism, problem drinking or drug abuse.   Skin Warm, dry and intact.   Eyes Pupils are equal, round and reactive to light and accommodation, extraocular movements are intact. No exophthalmos, no nystagmus.   Ears Normal external ears. External canal without occlusion. No scarring of the TM. No perforation of the TM.  Mouth and Throat Clear and moist. No irremedial deformities likely to interfere with breathing or swallowing.  Heart No murmurs, extra sounds, evidence of cardiomegaly. No pacemaker. No implantable defibrillator.  Lungs and Chest (excluding breasts) Normal chest expansion, respiratory rate,  breath sounds. No cyanosis.  Abdomen and Viscera No liver enlargement. No splenic enlargement. No masses, bruits, hernias or significant abdominal wall weakness.  Genitourinary  No inguinal or femoral hernia.  Spine and other musculoskeletal No tenderness, no limitation of motion, no deformities. No evidence of previous surgery.  Extremities No loss or impairment of leg, foot, toe, arm, hand, finger. No perceptible limp, deformities, atrophy, weakness, paralysis, clubbing, edema, hypotonia. Patient has sufficient grasp and prehension to maintain steering wheel grip. Patient has sufficient mobility and strength in the lower limbs to operate  pedals properly.  Neurologic Normal equilibrium, coordination, speech pattern. No paresthesia, asymmetry of deep tendon reflexes, sensory or positional abnormalities. No abnormality of patellar or Babinski's reflexes.  Gait Not antalgic or ataxic  Vascular Normal pulses. No carotid or arterial bruits. No varicose veins.     Certification Status: does meet standards for 2 year certificate.   Certification expires 05/29/2020.

## 2018-05-29 NOTE — Patient Instructions (Addendum)
   If you have lab work done today you will be contacted with your lab results within the next 2 weeks.  If you have not heard from us then please contact us. The fastest way to get your results is to register for My Chart.   IF you received an x-ray today, you will receive an invoice from [facility]. Please contact [facility] at [phone] with questions or concerns regarding your invoice.   IF you received labwork today, you will receive an invoice from [facility]. Please contact [facility] at [phone] with questions or concerns regarding your invoice.   Our billing staff will not be able to assist you with questions regarding bills from these companies.  You will be contacted with the lab results as soon as they are available. The fastest way to get your results is to activate your My Chart account. Instructions are located on the last page of this paperwork. If you have not heard from us regarding the results in 2 weeks, please contact this office.     Living With Post-Traumatic Stress Disorder If you have been diagnosed with post-traumatic stress disorder (PTSD), you may be relieved that you now know why you have felt or behaved a certain way. Still, you may feel overwhelmed about the treatment ahead. You may also wonder how to get the support you need and how to deal with the condition day-to-day. If you are living with PTSD, there are ways to help you recover from it and manage your symptoms. How to manage lifestyle changes Managing stress Stress is your body's reaction to life changes and events, both good and bad. Stress can make PTSD worse. Take the following steps to cope with stress:  Talk with your health care provider or a counselor if you would like to learn more about techniques to reduce your stress. He or she may suggest some stress reduction techniques such as: ? Muscle relaxation exercises. ? Regular exercise. ? Meditation, yoga, or other  mind-body exercises. ? Breathing exercises. ? Listening to quiet music. ? Spending time outside.  Maintain a healthy lifestyle. Eat a healthy diet, exercise regularly, get plenty of sleep, and take time to relax.  Spend time with others. Talk with them about how you are feeling and what kind of support you need. Try to not isolate yourself, even though you may feel like doing that. Isolating yourself can delay your recovery.  Do activities and hobbies that you enjoy.  Pace yourself when doing stressful things. Take breaks, and reward yourself when you finish. Make sure that you do not overload your schedule.  Medicines Your health care provider may suggest certain medicines if he or she feels that they will help to improve your condition. Antidepressants or antipsychotic medicines may be used to treat PTSD. Avoid using alcohol and other substances that may prevent your medicines from working properly (may interact). It is also important to:  Talk with your pharmacist or health care provider about all medicines that you take, their possible side effects, and which medicines are safe to take together.  Make it your goal to take part in all treatment decisions (shared decision-making). Ask about possible side effects of medicines that your health care provider recommends, and tell him or her how you feel about having those side effects. It is best if shared decision-making with your health care provider is part of your total treatment plan.  If your health care provider prescribes a medicine, you may not notice the   full benefits of it for 4-8 weeks. Most people who are treated for PTSD need to take medicine for at least 6-12 months after they feel better. If you are taking medicines as part of your treatment, do not stop taking medicines before you ask your health care provider if it is safe to stop. You may need to have the medicine slowly decreased (tapered) over time to lower the risk of harmful  side effects. Relationships Many people who have PTSD have difficulty trusting others. Make an effort to:  Take risks and develop trust with close friends and family members. Developing trust in others can help you feel safe and connect you with emotional support.  Be open and honest about your feelings.  Try to have fun and relax in safe spaces, such as with friends and family.  Think about going to couples counseling, family education classes, or family therapy. Your loved ones may not always know how to be supportive. Therapy can be helpful for everyone.  How to recognize changes in your condition Be aware of your symptoms and how often you have them. The following symptoms mean that you need to seek help for your PTSD:  You feel suspicious and angry.  You have repeated flashbacks.  You avoid going out or being with others.  You have an increasing number of fights with close friends or family members, such as your spouse.  You have thoughts about hurting yourself or others.  You cannot get relief from feelings of depression or anxiety.  Where to find support Talking to others  Explain that PTSD is a mental health problem. It is something that a person can develop after experiencing or seeing a life-threatening event. Tell them that PTSD makes you feel stress like you did during the event.  Talk to your loved ones about the symptoms you have. Also tell them what things or situations can cause symptoms to start (are triggers for you).  Assure your loved ones that there are treatments to help PTSD. Discuss possibly seeking family therapy or couples therapy.  If you are worried or fearful about seeking treatment, ask for support. Finances Not all insurance plans cover mental health care, so it is important to check with your insurance carrier. If paying for co-pays or counseling services is a problem, search for a local or county mental health care center. Public mental health  care services may be offered there at a low cost or no cost when you are not able to see a private health care provider. If you are a veteran, contact a local veterans organization or veterans hospital for more information. If you are taking medicine for PTSD, you may be able to get the genericform, which may be less expensive than brand-name medicine. Some makers of prescription medicines also offer help to patients who cannot afford the medicines that they need. Community resources  Find a support group in your community. Often, groups are available for military veterans, trauma victims, and family members or caregivers.  Look into volunteer opportunities. Taking part in these can help you feel more connected to your community.  Contact a local organization to find out if you are eligible for a service dog.  Keep daily contact with at least one trusted friend or family member. Follow these instructions at home: Lifestyle  Exercise regularly. Try to do 30 or more minutes of physical activity on most days of the week.  Try to get 7-9 hours of sleep each night. To help   with sleep: ? Keep your bedroom cool and dark. ? Do not eat a heavy meal during the hour before you go to bed. ? Do not drink alcohol or caffeinated drinks before bed. ? Avoid screen time before bedtime. This means avoiding use of your TV, computer, tablet, and cell phone.  Avoid using alcohol or drugs.  Practice self-soothing skills and use them daily.  Try to have fun and seek humor in your life. General instructions  If your PTSD is affecting your marriage or family, seek help from a family therapist.  Take over-the-counter and prescription medicines only as told by your health care provider.  Make sure to let all of your health care providers know that you have PTSD. This is especially important if you are having surgery or need to be admitted to the hospital.  Keep all follow-up visits as told by your health care  providers. This is important. Where to find more information: Go to this website to find more information about PTSD, treatment for PTSD, and how to get support:  National [facility] for PTSD: www.ptsd.va.gov  Contact a health care provider if:  Your symptoms get worse or they do not get better. Get help right away if:  You have thoughts about hurting yourself or others. If you ever feel like you may hurt yourself or others, or have thoughts about taking your own life, get help right away. You can go to your nearest emergency department or call:  Your local emergency services (911 in the [place]  A suicide crisis helpline, such as the National Suicide Prevention Lifeline at 1-800-273-8255. This is open 24-hours a day.  Summary  If you are living with PTSD, there are ways to help you recover from it and manage your symptoms.  Find supportive environments and people who understand PTSD. Spend time in those places, and maintain contact with those people.  Work with your health care team to create a management plan that includes counseling, stress management techniques, and healthy lifestyle habits. This information is not intended to replace advice given to you by your health care provider. Make sure you discuss any questions you have with your health care provider. Document Released: 10/26/2016 Document Revised: 10/26/2016 Document Reviewed: 10/26/2016 Elsevier Interactive Patient Education  2018 Elsevier Inc.  

## 2018-05-29 NOTE — Progress Notes (Signed)
[place] 52 y.o.   Chief Complaint  Patient presents with  . Medication Refill    ZOLOFT per patient wants the [facility] name and wants the COLOGARD KIT    HISTORY OF PRESENT ILLNESS: This is a 52 y.o. male with history of PTSD on Zoloft, here for follow-up and medication refill.  Doing well.  Has no complaints.  HPI   Prior to Admission medications   Medication Sig Start Date End Date Taking? Authorizing Provider  sertraline (ZOLOFT) 50 MG tablet TAKE 1 TABLET BY MOUTH EVERY DAY 05/13/18   McVey, Gelene Mink, PA-C  triamcinolone (NASACORT ALLERGY 24HR) 55 MCG/ACT AERO nasal inhaler Place 2 sprays into the nose daily.    [provider]    No Known Allergies  Patient Active Problem List   Diagnosis Date Noted  . PTSD (post-traumatic stress disorder) 10/20/2017    Past Medical History:  Diagnosis Date  . Allergy     No past surgical history on file.  Social History   Socioeconomic History  . Marital status: Married    Spouse name: Not on file  . Number of children: Not on file  . Years of education: Not on file  . Highest education level: Not on file  Occupational History  . Not on file  Social Needs  . Financial resource strain: Not on file  . Food insecurity:    Worry: Not on file    Inability: Not on file  . Transportation needs:    Medical: Not on file    Non-medical: Not on file  Tobacco Use  . Smoking status: Current Some Day Smoker    Types: Cigars  . Smokeless tobacco: Never Used  Substance and Sexual Activity  . Alcohol use: Yes    Comment: occas  . Drug use: No  . Sexual activity: Not on file  Lifestyle  . Physical activity:    Days per week: Not on file    Minutes per session: Not on file  . Stress: Not on file  Relationships  . Social connections:    Talks on phone: Not on file    Gets together: Not on file    Attends religious service: Not on file    Active member of club or organization: Not on file    Attends  meetings of clubs or organizations: Not on file    Relationship status: Not on file  . Intimate partner violence:    Fear of current or ex partner: Not on file    Emotionally abused: Not on file    Physically abused: Not on file    Forced sexual activity: Not on file  Other Topics Concern  . Not on file  Social History Narrative  . Not on file    Family History  Problem Relation Age of Onset  . Hypertension Mother   . Arthritis Mother      Review of Systems  Constitutional: Negative.  Negative for chills and fever.  HENT: Negative.  Negative for congestion and sore throat.   Eyes: Negative.  Negative for blurred vision and double vision.  Respiratory: Negative.  Negative for cough and shortness of breath.   Cardiovascular: Negative.  Negative for chest pain and palpitations.  Gastrointestinal: Negative.  Negative for heartburn, nausea and vomiting.  Genitourinary: Negative.  Negative for dysuria and hematuria.  Musculoskeletal: Negative.  Negative for joint pain and myalgias.  Skin: Negative.  Negative for rash.  Neurological: Negative.  Negative for dizziness, focal weakness  and headaches.  Endo/Heme/Allergies: Negative.   All other systems reviewed and are negative.     Physical Exam  Constitutional: He is oriented to person, place, and time. He appears well-developed and well-nourished.  HENT:  Head: Normocephalic and atraumatic.  Nose: Nose normal.  Mouth/Throat: Oropharynx is clear and moist.  Eyes: Pupils are equal, round, and reactive to light. Conjunctivae and EOM are normal.  Neck: Normal range of motion. Neck supple.  Cardiovascular: Normal rate, regular rhythm and normal heart sounds.  Pulmonary/Chest: Effort normal and breath sounds normal.  Musculoskeletal: Normal range of motion.  Neurological: He is alert and oriented to person, place, and time. No sensory deficit. He exhibits normal muscle tone. Coordination normal.  Skin: Skin is warm and dry.    Psychiatric: He has a normal mood and affect. His behavior is normal.  Vitals reviewed.   A total of 25 minutes was spent in the room with the patient, greater than 50% of which was in counseling/coordination of care regarding diagnosis, medications, prognosis, and need for follow-up.   ASSESSMENT & PLAN: [name] was seen today for medication refill.  Diagnoses and all orders for this visit:  PTSD (post-traumatic stress disorder) -     sertraline (ZOLOFT) 50 MG tablet; Take 1 tablet (50 mg total) by mouth daily.  Colon cancer screening -     Cologuard    Patient Instructions       If you have lab work done today you will be contacted with your lab results within the next 2 weeks.  If you have not heard from us then please contact us. The fastest way to get your results is to register for My Chart.   IF you received an x-ray today, you will receive an invoice from [facility]. Please contact [facility] at [phone] with questions or concerns regarding your invoice.   IF you received labwork today, you will receive an invoice from [facility]. Please contact [facility] at [phone] with questions or concerns regarding your invoice.   Our billing staff will not be able to assist you with questions regarding bills from these companies.  You will be contacted with the lab results as soon as they are available. The fastest way to get your results is to activate your My Chart account. Instructions are located on the last page of this paperwork. If you have not heard from us regarding the results in 2 weeks, please contact this office.     Living With Post-Traumatic Stress Disorder If you have been diagnosed with post-traumatic stress disorder (PTSD), you may be relieved that you now know why you have felt or behaved a certain way. Still, you may feel overwhelmed about the treatment ahead. You may also wonder how to get the support you need and how to deal with the  condition day-to-day. If you are living with PTSD, there are ways to help you recover from it and manage your symptoms. How to manage lifestyle changes Managing stress Stress is your body's reaction to life changes and events, both good and bad. Stress can make PTSD worse. Take the following steps to cope with stress:  Talk with your health care provider or a counselor if you would like to learn more about techniques to reduce your stress. He or she may suggest some stress reduction techniques such as: ? Muscle relaxation exercises. ? Regular exercise. ? Meditation, yoga, or other mind-body exercises. ? Breathing exercises. ? Listening to quiet music. ? Spending time outside.  Maintain   a healthy lifestyle. Eat a healthy diet, exercise regularly, get plenty of sleep, and take time to relax.  Spend time with others. Talk with them about how you are feeling and what kind of support you need. Try to not isolate yourself, even though you may feel like doing that. Isolating yourself can delay your recovery.  Do activities and hobbies that you enjoy.  Pace yourself when doing stressful things. Take breaks, and reward yourself when you finish. Make sure that you do not overload your schedule.  Medicines Your health care provider may suggest certain medicines if he or she feels that they will help to improve your condition. Antidepressants or antipsychotic medicines may be used to treat PTSD. Avoid using alcohol and other substances that may prevent your medicines from working properly (may interact). It is also important to:  Talk with your pharmacist or health care provider about all medicines that you take, their possible side effects, and which medicines are safe to take together.  Make it your goal to take part in all treatment decisions (shared decision-making). Ask about possible side effects of medicines that your health care provider recommends, and tell him or her how you feel about  having those side effects. It is best if shared decision-making with your health care provider is part of your total treatment plan.  If your health care provider prescribes a medicine, you may not notice the full benefits of it for 4-8 weeks. Most people who are treated for PTSD need to take medicine for at least 6-12 months after they feel better. If you are taking medicines as part of your treatment, do not stop taking medicines before you ask your health care provider if it is safe to stop. You may need to have the medicine slowly decreased (tapered) over time to lower the risk of harmful side effects. Relationships Many people who have PTSD have difficulty trusting others. Make an effort to:  Take risks and develop trust with close friends and family members. Developing trust in others can help you feel safe and connect you with emotional support.  Be open and honest about your feelings.  Try to have fun and relax in safe spaces, such as with friends and family.  Think about going to couples counseling, family education classes, or family therapy. Your loved ones may not always know how to be supportive. Therapy can be helpful for everyone.  How to recognize changes in your condition Be aware of your symptoms and how often you have them. The following symptoms mean that you need to seek help for your PTSD:  You feel suspicious and angry.  You have repeated flashbacks.  You avoid going out or being with others.  You have an increasing number of fights with close friends or family members, such as your spouse.  You have thoughts about hurting yourself or others.  You cannot get relief from feelings of depression or anxiety.  Where to find support Talking to others  Explain that PTSD is a mental health problem. It is something that a person can develop after experiencing or seeing a life-threatening event. Tell them that PTSD makes you feel stress like you did during the  event.  Talk to your loved ones about the symptoms you have. Also tell them what things or situations can cause symptoms to start (are triggers for you).  Assure your loved ones that there are treatments to help PTSD. Discuss possibly seeking family therapy or couples therapy.  If you   are worried or fearful about seeking treatment, ask for support. Finances Not all insurance plans cover mental health care, so it is important to check with your insurance carrier. If paying for co-pays or counseling services is a problem, search for a local or county mental health care center. Public mental health care services may be offered there at a low cost or no cost when you are not able to see a private health care provider. If you are a veteran, contact a local veterans organization or veterans hospital for more information. If you are taking medicine for PTSD, you may be able to get the genericform, which may be less expensive than brand-name medicine. Some makers of prescription medicines also offer help to patients who cannot afford the medicines that they need. Community resources  Find a support group in your community. Often, groups are available for [employer] veterans, trauma victims, and family members or caregivers.  Look into volunteer opportunities. Taking part in these can help you feel more connected to your community.  Contact a local organization to find out if you are eligible for a service dog.  Keep daily contact with at least one trusted friend or family member. Follow these instructions at home: Lifestyle  Exercise regularly. Try to do 30 or more minutes of physical activity on most days of the week.  Try to get 7-9 hours of sleep each night. To help with sleep: ? Keep your bedroom cool and dark. ? Do not eat a heavy meal during the hour before you go to bed. ? Do not drink alcohol or caffeinated drinks before bed. ? Avoid screen time before bedtime. This means avoiding use of  your TV, computer, tablet, and cell phone.  Avoid using alcohol or drugs.  Practice self-soothing skills and use them daily.  Try to have fun and seek humor in your life. General instructions  If your PTSD is affecting your marriage or family, seek help from a family therapist.  Take over-the-counter and prescription medicines only as told by your health care provider.  Make sure to let all of your health care providers know that you have PTSD. This is especially important if you are having surgery or need to be admitted to the hospital.  Keep all follow-up visits as told by your health care providers. This is important. Where to find more information: Go to this website to find more information about PTSD, treatment for PTSD, and how to get support:  [facility] for PTSD: www.ptsd.[employer].cz  Contact a health care provider if:  Your symptoms get worse or they do not get better. Get help right away if:  You have thoughts about hurting yourself or others. If you ever feel like you may hurt yourself or others, or have thoughts about taking your own life, get help right away. You can go to your nearest emergency department or call:  Your local emergency services (911 in the [place]  A suicide crisis helpline, such as the [facility] at (401)871-9825. This is open 24-hours a day.  Summary  If you are living with PTSD, there are ways to help you recover from it and manage your symptoms.  Find supportive environments and people who understand PTSD. Spend time in those places, and maintain contact with those people.  Work with your health care team to create a management plan that includes counseling, stress management techniques, and healthy lifestyle habits. This information is not intended to replace advice given to you by your  health care provider. Make sure you discuss any questions you have with your health care provider. Document Released: 10/26/2016  Document Revised: 10/26/2016 Document Reviewed: 10/26/2016 Elsevier Interactive Patient Education  2018 Elsevier Inc.     Miguel Sagardia, MD [facility]  

## 2018-05-29 NOTE — Patient Instructions (Addendum)

## 2018-06-05 ENCOUNTER — Ambulatory Visit: Payer: Self-pay | Admitting: *Deleted

## 2018-06-05 ENCOUNTER — Telehealth: Payer: Self-pay | Admitting: Emergency Medicine

## 2018-06-05 NOTE — Telephone Encounter (Signed)
[facility]

## 2018-06-05 NOTE — Telephone Encounter (Signed)
Pt called to request generic form of Zoloft due to cost. During call [place]  Informed pt [facility] day [facility] [address] [facility] Pt states he will pick up there; had been using a different pharmacy. Offered to transfer, states he will pick up. Instructed to CB for any further issues.  Reason for Disposition . Caller has medication question only, adult not sick, and triager answers question  Answer Assessment - Initial Assessment Questions 1. SYMPTOMS: "Do you have any symptoms?"     no 2. SEVERITY: If symptoms are present, ask "Are they mild, moderate or severe?"     no  Protocols used: MEDICATION QUESTION CALL-A-AH

## 2018-06-05 NOTE — Telephone Encounter (Signed)
Copied from CRM [phone]. Topic: General - Other >> Jun 05, 2018  9:18 AM Gerrianne ScalePayne, Lemon Whitacre L wrote: Reason for CRM: pt called [name] back couldn't reach anyone please call pt back at [phone]

## 2018-08-03 ENCOUNTER — Emergency Department (HOSPITAL_COMMUNITY): Payer: 59

## 2018-08-03 ENCOUNTER — Ambulatory Visit (HOSPITAL_COMMUNITY)
Admission: EM | Admit: 2018-08-03 | Discharge: 2018-08-03 | Disposition: A | Discharge status: Home / Self Care | Payer: 59 | Source: Home / Self Care

## 2018-08-03 ENCOUNTER — Encounter (HOSPITAL_COMMUNITY): Payer: Self-pay | Admitting: *Deleted

## 2018-08-03 ENCOUNTER — Emergency Department (HOSPITAL_COMMUNITY)
Admission: EM | Admit: 2018-08-03 | Discharge: 2018-08-03 | Disposition: A | Discharge status: Home / Self Care | Payer: 59 | Attending: Emergency Medicine | Admitting: Emergency Medicine

## 2018-08-03 ENCOUNTER — Other Ambulatory Visit: Payer: Self-pay

## 2018-08-03 DIAGNOSIS — S39012A Strain of muscle, fascia and tendon of lower back, initial encounter: Secondary | ICD-10-CM

## 2018-08-03 DIAGNOSIS — S060X0A Concussion without loss of consciousness, initial encounter: Secondary | ICD-10-CM

## 2018-08-03 DIAGNOSIS — Y929 Unspecified place or not applicable: Secondary | ICD-10-CM | POA: Insufficient documentation

## 2018-08-03 DIAGNOSIS — Y9389 Activity, other specified: Secondary | ICD-10-CM | POA: Diagnosis not present

## 2018-08-03 DIAGNOSIS — Y999 Unspecified external cause status: Secondary | ICD-10-CM | POA: Diagnosis not present

## 2018-08-03 DIAGNOSIS — S298XXA Other specified injuries of thorax, initial encounter: Secondary | ICD-10-CM

## 2018-08-03 DIAGNOSIS — R51 Headache: Secondary | ICD-10-CM | POA: Diagnosis present

## 2018-08-03 DIAGNOSIS — S20211A Contusion of right front wall of thorax, initial encounter: Secondary | ICD-10-CM

## 2018-08-03 MED ORDER — TRAMADOL HCL 50 MG PO TABS: 50.0000 mg | ORAL_TABLET | Freq: Four times a day (QID) | ORAL | 0 refills | Status: DC | PRN

## 2018-08-03 MED ORDER — IBUPROFEN 800 MG PO TABS: 800.0000 mg | ORAL_TABLET | Freq: Three times a day (TID) | ORAL | 0 refills | Status: DC | PRN
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
## 2018-08-03 NOTE — Discharge Instructions (Addendum)
Your CT scan and x-rays did not show any abnormalities at this time.  Return here as needed.  Use ice and heat on the areas that are sore.

## 2018-08-03 NOTE — ED Notes (Signed)
Patient verbalizes understanding of discharge instructions. Opportunity for questioning and answers were provided. Armband removed by staff, pt discharged from ED.  
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
## 2019-05-17 ENCOUNTER — Other Ambulatory Visit: Payer: Self-pay | Admitting: Emergency Medicine

## 2019-05-17 DIAGNOSIS — F4310 Post-traumatic stress disorder, unspecified: Secondary | ICD-10-CM

## 2019-05-17 NOTE — Telephone Encounter (Signed)
Forwarding medication refill request to the clinical pool for review. 

## 2019-05-20 ENCOUNTER — Telehealth: Payer: Self-pay | Admitting: *Deleted

## 2019-05-20 NOTE — Telephone Encounter (Signed)
Sent to scheduling

## 2019-06-25 ENCOUNTER — Other Ambulatory Visit: Payer: Self-pay | Admitting: Emergency Medicine

## 2019-06-25 DIAGNOSIS — F4310 Post-traumatic stress disorder, unspecified: Secondary | ICD-10-CM

## 2019-06-25 NOTE — Telephone Encounter (Signed)
Pls schedule patient an appt to f/u for PTSD and refills

## 2019-06-25 NOTE — Telephone Encounter (Signed)
Requested medication (s) are due for refill today: yes  Requested medication (s) are on the active medication list: yes  Last refill:  05/20/2019  Future visit scheduled: no  Notes to clinic:  no valid encounter in last 6 months    Requested Prescriptions  Pending Prescriptions Disp Refills   sertraline (ZOLOFT) 50 MG tablet [Pharmacy Med Name: SERTRALINE HCL 50 MG TABLET] 30 tablet 0    Sig: TAKE 1 TABLET BY MOUTH EVERY DAY      Psychiatry:  Antidepressants - SSRI Failed - 06/25/2019 12:06 AM      Failed - Valid encounter within last 6 months    Recent Outpatient Visits           1 year ago PTSD (post-traumatic stress disorder)   [facility] [place], Ines Bloomer, MD   1 year ago 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

## 2019-06-30 NOTE — Telephone Encounter (Signed)
LVM  to schedule med refill appt

## 2019-07-18 ENCOUNTER — Ambulatory Visit (INDEPENDENT_AMBULATORY_CARE_PROVIDER_SITE_OTHER): Payer: 59 | Admitting: Otolaryngology

## 2019-07-24 NOTE — Telephone Encounter (Signed)
Called pt put on schedule for med refills 07/31/2019

## 2019-07-31 ENCOUNTER — Ambulatory Visit: Payer: 59 | Admitting: Emergency Medicine

## 2019-08-01 ENCOUNTER — Encounter: Payer: Self-pay | Admitting: Emergency Medicine

## 2019-09-18 ENCOUNTER — Ambulatory Visit: Payer: 59 | Attending: Family

## 2019-09-18 DIAGNOSIS — Z23 Encounter for immunization: Secondary | ICD-10-CM

## 2019-09-18 NOTE — Progress Notes (Signed)
   Covid-19 Vaccination Clinic  Name:  JERIMY JOHANSON    MRN: [id number] DOB: Jun 26, 1966  09/18/2019  Mr. Alred was observed post Covid-19 immunization for 15 minutes without incident. He was provided with Vaccine Information Sheet and instruction to access the V-Safe system.   Mr. Elahi was instructed to call 911 with any severe reactions post vaccine: Marland Kitchen Difficulty breathing  . Swelling of face and throat  . A fast heartbeat  . A bad rash all over body  . Dizziness and weakness   Immunizations Administered    Name Date Dose VIS Date Route   Moderna COVID-19 Vaccine 09/18/2019 11:03 AM 0.5 mL 06/10/2019 Intramuscular   Manufacturer: Moderna   Lot: [id number]   NDC: 86767-209-47

## 2019-10-10 ENCOUNTER — Ambulatory Visit (INDEPENDENT_AMBULATORY_CARE_PROVIDER_SITE_OTHER): Payer: 59 | Admitting: Otolaryngology

## 2019-10-21 ENCOUNTER — Ambulatory Visit: Payer: 59 | Attending: Family

## 2019-10-21 DIAGNOSIS — Z23 Encounter for immunization: Secondary | ICD-10-CM

## 2019-10-21 NOTE — Progress Notes (Signed)
   Covid-19 Vaccination Clinic  Name:  Fernando Park    MRN: [id number] DOB: 1966/01/17  10/21/2019  [name] was observed post Covid-19 immunization for 15 minutes without incident. He was provided with Vaccine Information Sheet and instruction to access the V-Safe system.   [name] was instructed to call 911 with any severe reactions post vaccine: Marland Kitchen Difficulty breathing  . Swelling of face and throat  . A fast heartbeat  . A bad rash all over body  . Dizziness and weakness   Immunizations Administered    Name Date Dose VIS Date Route   Moderna COVID-19 Vaccine 10/21/2019 12:12 PM 0.5 mL 06/10/2019 Intramuscular   Manufacturer: Moderna   Lot: [id number]   NDC: 28979-150-41

## 2019-10-24 ENCOUNTER — Ambulatory Visit (INDEPENDENT_AMBULATORY_CARE_PROVIDER_SITE_OTHER): Payer: 59 | Admitting: Otolaryngology

## 2019-11-05 ENCOUNTER — Ambulatory Visit (INDEPENDENT_AMBULATORY_CARE_PROVIDER_SITE_OTHER): Payer: 59 | Admitting: Otolaryngology
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
## 2020-04-20 ENCOUNTER — Ambulatory Visit: Payer: No Typology Code available for payment source | Admitting: Emergency Medicine

## 2023-02-07 DIAGNOSIS — M25552 Pain in left hip: Secondary | ICD-10-CM | POA: Diagnosis not present

## 2023-02-07 DIAGNOSIS — M5416 Radiculopathy, lumbar region: Secondary | ICD-10-CM | POA: Diagnosis not present

## 2023-10-04 DIAGNOSIS — M25552 Pain in left hip: Secondary | ICD-10-CM | POA: Diagnosis not present

## 2023-10-04 DIAGNOSIS — M5416 Radiculopathy, lumbar region: Secondary | ICD-10-CM | POA: Diagnosis not present

## 2024-03-21 ENCOUNTER — Ambulatory Visit (INDEPENDENT_AMBULATORY_CARE_PROVIDER_SITE_OTHER)

## 2024-03-21 VITALS — BP 137/73 | HR 63 | Temp 99.1°F | Resp 16 | Ht 70.0 in | Wt 150.2 lb

## 2024-03-21 DIAGNOSIS — F4310 Post-traumatic stress disorder, unspecified: Secondary | ICD-10-CM | POA: Diagnosis not present

## 2024-03-21 DIAGNOSIS — L84 Corns and callosities: Secondary | ICD-10-CM

## 2024-03-21 DIAGNOSIS — F17290 Nicotine dependence, other tobacco product, uncomplicated: Secondary | ICD-10-CM | POA: Diagnosis not present

## 2024-03-21 DIAGNOSIS — Z7689 Persons encountering health services in other specified circumstances: Secondary | ICD-10-CM

## 2024-03-21 MED ORDER — FLUOXETINE HCL 10 MG PO CAPS: 10.0000 mg | ORAL_CAPSULE | Freq: Every day | ORAL | 0 refills | Status: DC

## 2024-03-21 NOTE — Progress Notes (Unsigned)
 Patient ID: Fernando Park, male    DOB: [birth year]  MRN: [id number]  CC: Establish Care (Patient is here to established care with provider./~health hx address/~care gaps address/ )   Subjective: Fernando Park is a 58 y.o. male with past medical history of PTSD who presents to clinic to establish care.  Pt reports provider detected a heart murmur 2 yrs ago. Denies shortness of breath, chest pain or fatigue.  Pt reports history of PTSD, was seeing a psychiatrist in the past and has taken medication for it intermittently.  Patient Active Problem List   Diagnosis Date Noted   PTSD (post-traumatic stress disorder) 10/20/2017     Current Outpatient Medications on File Prior to Visit  Medication Sig Dispense Refill   triamcinolone (NASACORT ALLERGY 24HR) 55 MCG/ACT AERO nasal inhaler Place 2 sprays into the nose daily.     No current facility-administered medications on file prior to visit.    No Known Allergies  Social History   Socioeconomic History   Marital status: Married    Spouse name: Not on file   Number of children: Not on file   Years of education: Not on file   Highest education level: Not on file  Occupational History   Not on file  Tobacco Use   Smoking status: Some Days    Types: Cigars   Smokeless tobacco: Never  Substance and Sexual Activity   Alcohol use: Yes    Comment: occas   Drug use: No   Sexual activity: Not on file  Other Topics Concern   Not on file  Social History Narrative   Not on file   Social Drivers of Health   Financial Resource Strain: Low Risk  (03/21/2024)   Overall Financial Resource Strain (CARDIA)    Difficulty of Paying Living Expenses: Not very hard  Food Insecurity: No Food Insecurity (03/21/2024)   Hunger Vital Sign    Worried About Running Out of Food in the Last Year: Never true    Ran Out of Food in the Last Year: Never true  Transportation Needs: No Transportation Needs (03/21/2024)   PRAPARE - Therapist, art (Medical): No    Lack of Transportation (Non-Medical): No  Physical Activity: Sufficiently Active (03/21/2024)   Exercise Vital Sign    Days of Exercise per Week: 5 days    Minutes of Exercise per Session: 30 min  Stress: No Stress Concern Present (03/21/2024)   [employer] of Occupational Health - Occupational Stress Questionnaire    Feeling of Stress: Not at all  Social Connections: Socially Integrated (03/21/2024)   Social Connection and Isolation Panel    Frequency of Communication with Friends and Family: Twice a week    Frequency of Social Gatherings with Friends and Family: Twice a week    Attends Religious Services: More than 4 times per year    Active Member of [employer] or Organizations: Patient unable to answer    Attends Banker Meetings: More than 4 times per year    Marital Status: Married  Catering manager Violence: Not At Risk (03/21/2024)   Humiliation, Afraid, Rape, and Kick questionnaire    Fear of Current or Ex-Partner: No    Emotionally Abused: No    Physically Abused: No    Sexually Abused: No    Family History  Problem Relation Age of Onset   Hypertension Mother    Arthritis Mother     No past surgical history on  file.  ROS: Review of Systems Negative except as stated above  PHYSICAL EXAM: BP 137/73   Pulse 63   Temp 99.1 F (37.3 C) (Oral)   Resp 16   Ht 5' 10 (1.778 m)   Wt 150 lb 3.2 oz (68.1 kg)   SpO2 96%   BMI 21.55 kg/m   Physical Exam  General: well-appearing, no acute distress Skin: no jaundice, rashes, or lesions Cardiovascular: holosystolic murmur, peripheral pulses 2+ bilaterally Chest: no skeletal deformity, lungs clear to auscultation bilaterally, equal breath sounds bilaterally Extremities: no peripheral edema   ASSESSMENT AND PLAN:  1. Encounter to establish care with new provider (Primary)   2. PTSD (post-traumatic stress disorder) -  - FLUoxetine  (PROZAC ) 10 MG capsule; Take 1  capsule (10 mg total) by mouth daily.  Dispense: 30 capsule; Refill: 0  3. Callus of toe - No signs of osteomyelitis or acute cellulitis. Picture in Media tab.  - Ambulatory referral to Podiatry    Patient was given the opportunity to ask questions.  Patient verbalized understanding of the plan and was able to repeat key elements of the plan.   Orders Placed This Encounter  Procedures   Ambulatory referral to Podiatry     Requested Prescriptions   Signed Prescriptions Disp Refills   FLUoxetine  (PROZAC ) 10 MG capsule 30 capsule 0    Sig: Take 1 capsule (10 mg total) by mouth daily.    Return in about 1 month (around 04/20/2024) for physical, labs.  Sula Leavy Rode, PA-C

## 2024-04-18 ENCOUNTER — Other Ambulatory Visit: Payer: Self-pay

## 2024-04-18 DIAGNOSIS — F4310 Post-traumatic stress disorder, unspecified: Secondary | ICD-10-CM

## 2024-04-28 ENCOUNTER — Ambulatory Visit: Admitting: Podiatry

## 2024-04-28 DIAGNOSIS — L84 Corns and callosities: Secondary | ICD-10-CM | POA: Diagnosis not present

## 2024-04-28 NOTE — Progress Notes (Signed)
  Chief Complaint  Patient presents with   Callouses    Bilateral calluses Left foot sub met 4. And dorsal right 5th toe. Chronic issue. 7 pain with pressure. Non diabetic.      Discussed the use of AI scribe software for clinical note transcription with the patient, who gave verbal consent to proceed.  History of Present Illness Fernando Park is a 58 year old who presents with painful calluses on the right fifth toe and the ball of the left foot.  He has been experiencing calluses on the fifth toe of his right foot and on the ball of his left foot for an extended period. The callus on the right fifth toe becomes painful, leading him to file it down regularly. The callus on the ball of the left foot is not painful, so he does not perform any specific treatment for it.  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 His job requires him to work in water, necessitating the use of waterproof steel toe boots and two pairs of socks to keep his feet dry. He describes the boots as wide but uncomfortable, which may contribute to the development of calluses. He notes that many colleagues also experience foot or hip issues.  He has tried using store-bought pads for the calluses, but they do not adhere well and often come off with his socks.     Past Medical History:  Diagnosis Date   Allergy    History reviewed. No pertinent surgical history. No Known Allergies  Physical Exam EXTREMITIES: Calluses on the fifth toe of the right foot and the ball of the left foot.  There are palpable pedal pulses bilateral.  No appreciable edema is noted.  There are hyperkeratotic lesions noted on the dorsal lateral aspect of the right fifth toe PIPJ, and submet 3 just anterior to the metatarsal head on the left foot.  Epicritic sensation intact.    Results     Assessment and Plan Assessment & Plan Calluses and corns of right fifth toe and left foot Chronic calluses  and corns on the right fifth toe and the ball of the left foot, likely due to pressure from waterproof steel toe boots worn at work, which are not comfortable despite being wide. The right fifth toe corn is painful and requires regular filing. Conservative management is typically effective, but surgical options are available if the condition worsens. - Provide toe pads and spacers to protect the skin and reduce pressure. - Instruct on the use of Arcalis pads for lesion protection. - Advise on conservative management with regular shaving of calluses. - Discuss potential for surgical intervention if conservative measures fail. The corn on the right fifth toe and callus left submet 3 were shaved with a sterile #313 blade uneventfully today.  The patient did sign an Soil scientist indicating the insurance may decide this is not medically necessary.       Awanda CHARM Imperial, DPM, FACFAS [facility]     [address] [place], [place] [address]                Office [phone]  Fax [phone]

## 2024-05-02 ENCOUNTER — Ambulatory Visit (INDEPENDENT_AMBULATORY_CARE_PROVIDER_SITE_OTHER)

## 2024-05-02 VITALS — BP 133/71 | HR 74 | Temp 98.3°F | Resp 16 | Ht 70.0 in | Wt 151.6 lb

## 2024-05-02 DIAGNOSIS — Z13228 Encounter for screening for other metabolic disorders: Secondary | ICD-10-CM | POA: Diagnosis not present

## 2024-05-02 DIAGNOSIS — Z136 Encounter for screening for cardiovascular disorders: Secondary | ICD-10-CM

## 2024-05-02 DIAGNOSIS — Z13220 Encounter for screening for lipoid disorders: Secondary | ICD-10-CM | POA: Diagnosis not present

## 2024-05-02 DIAGNOSIS — Z114 Encounter for screening for human immunodeficiency virus [HIV]: Secondary | ICD-10-CM

## 2024-05-02 DIAGNOSIS — Z0000 Encounter for general adult medical examination without abnormal findings: Secondary | ICD-10-CM

## 2024-05-02 DIAGNOSIS — Z130 Encounter for screening for diseases of the blood and blood-forming organs and certain disorders involving the immune mechanism: Secondary | ICD-10-CM

## 2024-05-02 DIAGNOSIS — F4310 Post-traumatic stress disorder, unspecified: Secondary | ICD-10-CM

## 2024-05-02 DIAGNOSIS — Z1159 Encounter for screening for other viral diseases: Secondary | ICD-10-CM

## 2024-05-02 DIAGNOSIS — Z1329 Encounter for screening for other suspected endocrine disorder: Secondary | ICD-10-CM | POA: Diagnosis not present

## 2024-05-02 MED ORDER — FLUOXETINE HCL 10 MG PO CAPS: 10.0000 mg | ORAL_CAPSULE | Freq: Every day | ORAL | 2 refills | Status: AC

## 2024-05-02 NOTE — Progress Notes (Signed)
 Patient ID: CURLY MACKOWSKI, male    DOB: Dec 08, 1965  MRN: [id number]  CC: Annual Exam   Subjective: Damyon Mullane is a 58 y.o. male with past medical history of PTSD who presents to clinic for annual physical exam and labs.  No acute concerns at this time.  Patient reports that he presented to podiatry appointment that was scheduled last visit and found it helpful.   Patient Active Problem List   Diagnosis Date Noted   PTSD (post-traumatic stress disorder) 10/20/2017     Current Outpatient Medications on File Prior to Visit  Medication Sig Dispense Refill   triamcinolone (NASACORT ALLERGY 24HR) 55 MCG/ACT AERO nasal inhaler Place 2 sprays into the nose daily.     No current facility-administered medications on file prior to visit.    No Known Allergies  ROS: Review of Systems Negative except as stated above  PHYSICAL EXAM: BP 133/71   Pulse 74   Temp 98.3 F (36.8 C) (Oral)   Resp 16   Ht 5' 10 (1.778 m)   Wt 151 lb 9.6 oz (68.8 kg)   SpO2 98%   BMI 21.75 kg/m   Physical Exam  General: well-appearing, no acute distress Skin: no jaundice, rashes, or lesions Head: normocephalic, no lesions, no abnormal hair distribution or loss Eyes: anicteric sclera, pupils equally round and reactive to light and accommodation, extraocular movements intact Ears: no external lesions, tympanic membrane translucent  Nose: no septal deviation, turbinates clear Throat: trachea midline, no thyromegaly, moist mucus membranes Cardiovascular: regular heart rate and rhythm, normal S1/S2, no murmurs, gallops, or rubs, peripheral pulses 2+ bilaterally Chest: no skeletal deformity, lungs clear to auscultation bilaterally, equal breath sounds bilaterally Abdomen: soft, non-distended, non-tender to palpation, no hepatomegaly, no splenomegaly, normoactive bowel sounds Musculoskeletal: strength of upper and lower extremities equal bilaterally, 5/5, normal gait Extremities: no peripheral  edema, nails intact Neurologic: cranial nerves II-XII intact, brisk patellar reflexes   ASSESSMENT AND PLAN:  1. Encounter for annual wellness visit (Primary) - Complete physical exam performed today, no abnormal findings. - Routine labs to screen for heme, cardiovascular and metabolic disorders.  2. Encounter for lipid screening for cardiovascular disease - Lipid panel  3. PTSD (post-traumatic stress disorder) Mood has been stable - FLUoxetine  (PROZAC ) 10 MG capsule; Take 1 capsule (10 mg total) by mouth daily.  Dispense: 90 capsule; Refill: 2  4. Thyroid disorder screen - TSH Rfx on Abnormal to Free T4  5. Screening for blood disease - CBC  6. Screening for metabolic disorder - Comprehensive metabolic panel with GFR  7. Encounter for screening for HIV - HIV Antibody (routine testing w rflx)  8. Encounter for hepatitis C screening test for low risk patient - Hepatitis C antibody    Patient was given the opportunity to ask questions.  Patient verbalized understanding of the plan and was able to repeat key elements of the plan. Patient was given clear instructions to go to Emergency Department or return to medical center if symptoms don't improve, worsen, or new problems develop.The patient verbalized understanding.   Orders Placed This Encounter  Procedures   HIV Antibody (routine testing w rflx)   Hepatitis C antibody   TSH Rfx on Abnormal to Free T4   CBC   Comprehensive metabolic panel with GFR   Lipid panel     Requested Prescriptions   Signed Prescriptions Disp Refills   FLUoxetine  (PROZAC ) 10 MG capsule 90 capsule 2    Sig: Take 1 capsule (  10 mg total) by mouth daily.    Return in about 1 year (around 05/02/2025) for physical, labs.  Sula Leavy Rode, PA-C

## 2024-05-03 LAB — HIV ANTIBODY (ROUTINE TESTING W REFLEX): HIV Screen 4th Generation wRfx: NONREACTIVE

## 2024-05-03 LAB — COMPREHENSIVE METABOLIC PANEL WITH GFR
ALT: 19 IU/L (ref 0–44)
AST: 20 IU/L (ref 0–40)
Albumin: 4.2 g/dL (ref 3.8–4.9)
Alkaline Phosphatase: 72 IU/L (ref 47–123)
BUN/Creatinine Ratio: 12 (ref 9–20)
BUN: 12 mg/dL (ref 6–24)
Bilirubin Total: 0.3 mg/dL (ref 0.0–1.2)
CO2: 22 mmol/L (ref 20–29)
Calcium: 9.2 mg/dL (ref 8.7–10.2)
Chloride: 104 mmol/L (ref 96–106)
Creatinine, Ser: 1 mg/dL (ref 0.76–1.27)
Globulin, Total: 2.1 g/dL (ref 1.5–4.5)
Glucose: 112 mg/dL — ABNORMAL HIGH (ref 70–99)
Potassium: 4.2 mmol/L (ref 3.5–5.2)
Sodium: 139 mmol/L (ref 134–144)
Total Protein: 6.3 g/dL (ref 6.0–8.5)
eGFR: 88 mL/min/1.73 (ref >59)

## 2024-05-03 LAB — LIPID PANEL
Chol/HDL Ratio: 2.6 ratio (ref 0.0–5.0)
Cholesterol, Total: 131 mg/dL (ref 100–199)
HDL: 51 mg/dL (ref >39)
LDL Chol Calc (NIH): 60 mg/dL (ref 0–99)
Triglycerides: 110 mg/dL (ref 0–149)
VLDL Cholesterol Cal: 20 mg/dL (ref 5–40)

## 2024-05-03 LAB — CBC
Hematocrit: 42.1 % (ref 37.5–51.0)
Hemoglobin: 13.8 g/dL (ref 13.0–17.7)
MCH: 29.7 pg (ref 26.6–33.0)
MCHC: 32.8 g/dL (ref 31.5–35.7)
MCV: 91 fL (ref 79–97)
Platelets: 295 x10E3/uL (ref 150–450)
RBC: 4.64 x10E6/uL (ref 4.14–5.80)
RDW: 12.2 % (ref 11.6–15.4)
WBC: 9 x10E3/uL (ref 3.4–10.8)

## 2024-05-03 LAB — TSH RFX ON ABNORMAL TO FREE T4: TSH: 0.683 u[IU]/mL (ref 0.450–4.500)

## 2024-05-03 LAB — HEPATITIS C ANTIBODY: Hep C Virus Ab: NONREACTIVE

## 2024-05-06 ENCOUNTER — Ambulatory Visit: Payer: Self-pay

## 2025-05-04 ENCOUNTER — Encounter
